# Patient Record
Sex: Male | Born: 1960 | Race: White | Hispanic: Yes | Marital: Single | Smoking: Former smoker
Health system: Southern US, Community
[De-identification: ages and names within clinical notes are randomized; demographics above are authoritative.]

## PROBLEM LIST (undated history)

## (undated) DIAGNOSIS — I10 Essential (primary) hypertension: Secondary | ICD-10-CM

---

## 2011-08-08 ENCOUNTER — Emergency Department (HOSPITAL_COMMUNITY): Payer: Self-pay

## 2011-08-08 ENCOUNTER — Encounter (HOSPITAL_COMMUNITY): Payer: Self-pay | Admitting: Emergency Medicine

## 2011-08-08 ENCOUNTER — Emergency Department (HOSPITAL_COMMUNITY)
Admission: EM | Admit: 2011-08-08 | Discharge: 2011-08-08 | Disposition: A | Discharge status: Nursing Home (Includes ICF) | Payer: Self-pay | Source: Home / Self Care | Attending: Emergency Medicine | Admitting: Emergency Medicine

## 2011-08-08 ENCOUNTER — Encounter (HOSPITAL_COMMUNITY)
Admission: EM | Disposition: A | Discharge status: Home / Self Care | Payer: Self-pay | Source: Ambulatory Visit | Attending: Emergency Medicine

## 2011-08-08 ENCOUNTER — Encounter (HOSPITAL_COMMUNITY): Payer: Self-pay

## 2011-08-08 ENCOUNTER — Observation Stay (HOSPITAL_COMMUNITY)
Admission: EM | Admit: 2011-08-08 | Discharge: 2011-08-09 | Disposition: A | Discharge status: Home / Self Care | Payer: Self-pay | Source: Ambulatory Visit | Attending: General Surgery | Admitting: General Surgery

## 2011-08-08 ENCOUNTER — Ambulatory Visit: Admit: 2011-08-08 | Payer: Self-pay | Admitting: General Surgery

## 2011-08-08 DIAGNOSIS — K3580 Unspecified acute appendicitis: Principal | ICD-10-CM | POA: Insufficient documentation

## 2011-08-08 DIAGNOSIS — I10 Essential (primary) hypertension: Secondary | ICD-10-CM | POA: Insufficient documentation

## 2011-08-08 DIAGNOSIS — K37 Unspecified appendicitis: Secondary | ICD-10-CM

## 2011-08-08 DIAGNOSIS — R10813 Right lower quadrant abdominal tenderness: Secondary | ICD-10-CM

## 2011-08-08 HISTORY — PX: LAPAROSCOPIC APPENDECTOMY: SHX408

## 2011-08-08 HISTORY — DX: Essential (primary) hypertension: I10

## 2011-08-08 LAB — POCT URINALYSIS DIP (DEVICE)
Glucose, UA: NEGATIVE mg/dL
Ketones, ur: NEGATIVE mg/dL
Nitrite: NEGATIVE
Specific Gravity, Urine: 1.015 (ref 1.005–1.030)

## 2011-08-08 LAB — CBC WITH DIFFERENTIAL/PLATELET
Eosinophils Absolute: 0 10*3/uL (ref 0.0–0.7)
Hemoglobin: 17.4 g/dL — ABNORMAL HIGH (ref 13.0–17.0)
Lymphs Abs: 2.2 10*3/uL (ref 0.7–4.0)
MCH: 31.2 pg (ref 26.0–34.0)
MCV: 86.4 fL (ref 78.0–100.0)
Monocytes Relative: 6 % (ref 3–12)
Neutrophils Relative %: 81 % — ABNORMAL HIGH (ref 43–77)
RBC: 5.58 MIL/uL (ref 4.22–5.81)

## 2011-08-08 LAB — COMPREHENSIVE METABOLIC PANEL
Alkaline Phosphatase: 110 U/L (ref 39–117)
BUN: 13 mg/dL (ref 6–23)
GFR calc Af Amer: >90 mL/min (ref >90)
Glucose, Bld: 105 mg/dL — ABNORMAL HIGH (ref 70–99)
Potassium: 4.7 mEq/L (ref 3.5–5.1)
Total Bilirubin: 0.7 mg/dL (ref 0.3–1.2)
Total Protein: 8.3 g/dL (ref 6.0–8.3)

## 2011-08-08 SURGERY — APPENDECTOMY, LAPAROSCOPIC
Anesthesia: General | Site: Abdomen | Wound class: Clean Contaminated

## 2011-08-08 MED ORDER — IOHEXOL 300 MG/ML  SOLN
20.0000 mL | INTRAMUSCULAR | Status: AC
  Administered 2011-08-08: 20 mL via ORAL

## 2011-08-08 MED ORDER — ONDANSETRON 4 MG PO TBDP
ORAL_TABLET | ORAL | Status: AC
  Filled 2011-08-08: qty 1

## 2011-08-08 MED ORDER — SODIUM CHLORIDE 0.9 % IV SOLN
3.0000 g | Freq: Once | INTRAVENOUS | Status: AC
  Administered 2011-08-08: 3 g via INTRAVENOUS
  Filled 2011-08-08: qty 3

## 2011-08-08 MED ORDER — ONDANSETRON 4 MG PO TBDP
4.0000 mg | ORAL_TABLET | Freq: Once | ORAL | Status: AC
  Administered 2011-08-08: 4 mg via ORAL

## 2011-08-08 MED ORDER — HYDROMORPHONE HCL PF 1 MG/ML IJ SOLN
INTRAMUSCULAR | Status: AC
  Filled 2011-08-08: qty 1

## 2011-08-08 MED ORDER — HYDROMORPHONE HCL PF 1 MG/ML IJ SOLN
1.0000 mg | Freq: Once | INTRAMUSCULAR | Status: AC
  Administered 2011-08-08: 1 mg via INTRAMUSCULAR

## 2011-08-08 MED ORDER — IOHEXOL 300 MG/ML  SOLN
100.0000 mL | Freq: Once | INTRAMUSCULAR | Status: AC | PRN
  Administered 2011-08-08: 100 mL via INTRAVENOUS

## 2011-08-08 SURGICAL SUPPLY — 43 items
APPLIER CLIP ROT 10 11.4 M/L (STAPLE)
BLADE SURG ROTATE 9660 (MISCELLANEOUS) ×2 IMPLANT
CANISTER SUCTION 2500CC (MISCELLANEOUS) ×2 IMPLANT
CHLORAPREP W/TINT 26ML (MISCELLANEOUS) ×2 IMPLANT
CLIP APPLIE ROT 10 11.4 M/L (STAPLE) IMPLANT
CLOTH BEACON ORANGE TIMEOUT ST (SAFETY) ×2 IMPLANT
COVER SURGICAL LIGHT HANDLE (MISCELLANEOUS) ×2 IMPLANT
CUTTER LINEAR ENDO 35 ETS (STAPLE) ×2 IMPLANT
CUTTER LINEAR ENDO 35 ETS TH (STAPLE) ×2 IMPLANT
DECANTER SPIKE VIAL GLASS SM (MISCELLANEOUS) IMPLANT
DERMABOND ADHESIVE PROPEN (GAUZE/BANDAGES/DRESSINGS) ×1
DERMABOND ADVANCED (GAUZE/BANDAGES/DRESSINGS) ×1
DERMABOND ADVANCED .7 DNX12 (GAUZE/BANDAGES/DRESSINGS) ×1 IMPLANT
DERMABOND ADVANCED .7 DNX6 (GAUZE/BANDAGES/DRESSINGS) ×1 IMPLANT
DRAPE UTILITY 15X26 W/TAPE STR (DRAPE) ×4 IMPLANT
DRSG OPSITE 4X5.5 SM (GAUZE/BANDAGES/DRESSINGS) ×2 IMPLANT
ELECT REM PT RETURN 9FT ADLT (ELECTROSURGICAL) ×2
ELECTRODE REM PT RTRN 9FT ADLT (ELECTROSURGICAL) ×1 IMPLANT
ENDOLOOP SUT PDS II  0 18 (SUTURE)
ENDOLOOP SUT PDS II 0 18 (SUTURE) IMPLANT
GLOVE BIOGEL PI IND STRL 8 (GLOVE) ×1 IMPLANT
GLOVE BIOGEL PI INDICATOR 8 (GLOVE) ×1
GLOVE ECLIPSE 7.5 STRL STRAW (GLOVE) ×2 IMPLANT
GOWN STRL NON-REIN LRG LVL3 (GOWN DISPOSABLE) ×4 IMPLANT
KIT BASIN OR (CUSTOM PROCEDURE TRAY) ×2 IMPLANT
KIT ROOM TURNOVER OR (KITS) ×2 IMPLANT
NS IRRIG 1000ML POUR BTL (IV SOLUTION) ×2 IMPLANT
PAD ARMBOARD 7.5X6 YLW CONV (MISCELLANEOUS) ×4 IMPLANT
PENCIL BUTTON HOLSTER BLD 10FT (ELECTRODE) IMPLANT
POUCH SPECIMEN RETRIEVAL 10MM (ENDOMECHANICALS) ×2 IMPLANT
RELOAD /EVU35 (ENDOMECHANICALS) IMPLANT
RELOAD CUTTER ETS 35MM STAND (ENDOMECHANICALS) IMPLANT
SET IRRIG TUBING LAPAROSCOPIC (IRRIGATION / IRRIGATOR) ×2 IMPLANT
SPECIMEN JAR SMALL (MISCELLANEOUS) ×2 IMPLANT
SUT MNCRL AB 4-0 PS2 18 (SUTURE) ×2 IMPLANT
TOWEL OR 17X24 6PK STRL BLUE (TOWEL DISPOSABLE) ×2 IMPLANT
TOWEL OR 17X26 10 PK STRL BLUE (TOWEL DISPOSABLE) ×2 IMPLANT
TRAY FOLEY CATH 14FR (SET/KITS/TRAYS/PACK) ×2 IMPLANT
TRAY LAPAROSCOPIC (CUSTOM PROCEDURE TRAY) ×2 IMPLANT
TROCAR XCEL 12X100 BLDLESS (ENDOMECHANICALS) ×2 IMPLANT
TROCAR XCEL BLUNT TIP 100MML (ENDOMECHANICALS) ×2 IMPLANT
TROCAR XCEL NON-BLD 5MMX100MML (ENDOMECHANICALS) ×2 IMPLANT
WATER STERILE IRR 1000ML POUR (IV SOLUTION) IMPLANT

## 2011-08-08 NOTE — ED Notes (Signed)
Pt has RLQ pain since yesterday, had fever this am.  Thought his pain was d/t no bm for 3 days, but continues to hurt and very uncomfortable.

## 2011-08-08 NOTE — ED Provider Notes (Signed)
History     CSN: [id number]  Arrival date & time 08/08/11  1252   First MD Initiated Contact with Patient 08/08/11 1314      Chief Complaint  Patient presents with  . Abdominal Pain    (Consider location/radiation/quality/duration/timing/severity/associated sxs/prior treatment) HPI Comments: Pt with sudden onset constant nonradiating "crushing" RLQ abd pain starting today. Pain is worse with movement, better when he lies on his right side. He also tried Aleve and milk of magnesia without improvement. He took the Aleve several hours ago. C/o anorexia, states he felt feverish at home but no documented temperature. C/o abd distension starting today when the pain started. No N/V. States that the car ride over here was painful. Has been having issues with constipation for several days, but had a small BM this am which did not affect the pain. C/o oderous urine. No dysuria, urgency, frequency cloudy  urine, hematuria. No testicular pain,  Swelling, penile discharge. No h/o abdominal surgeries.   ROS as noted in HPI. All other ROS negative.   Patient is a 51 y.o. male presenting with abdominal pain. The history is provided by the patient. The history is limited by a language barrier. A language interpreter was used.  Abdominal Pain The primary symptoms of the illness include abdominal pain. The current episode started 3 to 5 hours ago. The onset of the illness was sudden. The problem has not changed since onset. The abdominal pain is located in the RLQ. The abdominal pain does not radiate. The abdominal pain is relieved by being still and certain positions. The abdominal pain is exacerbated by movement.  The patient has had a change in bowel habit. Additional symptoms associated with the illness include anorexia and constipation. Symptoms associated with the illness do not include chills, urgency, hematuria, frequency or back pain. Significant associated medical issues do not include diabetes,  gallstones or liver disease.    History reviewed. No pertinent past medical history.  History reviewed. No pertinent past surgical history.  History reviewed. No pertinent family history.  History  Substance Use Topics  . Smoking status: Not on file  . Smokeless tobacco: Not on file  . Alcohol Use: No      Review of Systems  Constitutional: Negative for chills.  Gastrointestinal: Positive for abdominal pain, constipation and anorexia.  Genitourinary: Negative for urgency, frequency and hematuria.  Musculoskeletal: Negative for back pain.    Allergies  Review of patient's allergies indicates no known allergies.  Home Medications   Current Outpatient Rx  Name Route Sig Dispense Refill  . MAGNESIUM HYDROXIDE 400 MG/5ML PO SUSP Oral Take by mouth daily as needed.    Marland Kitchen NAPROXEN SODIUM 220 MG PO TABS Oral Take 220 mg by mouth 2 (two) times daily with a meal.      BP 157/83  Pulse 70  Temp 98.9 F (37.2 C) (Oral)  Resp 18  SpO2 96%  Physical Exam  Nursing note and vitals reviewed. Constitutional: He is oriented to person, place, and time. He appears well-developed and well-nourished.  HENT:  Head: Normocephalic and atraumatic.  Eyes: Conjunctivae and EOM are normal.  Neck: Normal range of motion.  Cardiovascular: Normal rate.   Pulmonary/Chest: Effort normal. No respiratory distress.  Abdominal: Soft. Normal appearance and bowel sounds are normal. He exhibits no distension. There is tenderness. There is guarding and tenderness at McBurney's point. There is no rigidity, no rebound, no CVA tenderness and negative Murphy's sign. No hernia. Hernia confirmed negative in the right  inguinal area and confirmed negative in the left inguinal area.  Genitourinary: Testes normal and penis normal. Uncircumcised.  Musculoskeletal: Normal range of motion.  Neurological: He is alert and oriented to person, place, and time. Coordination normal.  Skin: Skin is warm and dry.    Psychiatric: He has a normal mood and affect. His behavior is normal. Judgment and thought content normal.    ED Course  Procedures (including critical care time)  Labs Reviewed  POCT URINALYSIS DIP (DEVICE) - Abnormal; Notable for the following:    Bilirubin Urine SMALL (*)     Hgb urine dipstick MODERATE (*)     Protein, ur 30 (*)     All other components within normal limits   No results found.   1. RLQ abdominal tenderness     Results for orders placed during the hospital encounter of 08/08/11  POCT URINALYSIS DIP (DEVICE)      Component Value Range   Glucose, UA NEGATIVE  NEGATIVE mg/dL   Bilirubin Urine SMALL (*) NEGATIVE   Ketones, ur NEGATIVE  NEGATIVE mg/dL   Specific Gravity, Urine 1.015  1.005 - 1.030   Hgb urine dipstick MODERATE (*) NEGATIVE   pH 7.5  5.0 - 8.0   Protein, ur 30 (*) NEGATIVE mg/dL   Urobilinogen, UA 0.2  0.0 - 1.0 mg/dL   Nitrite NEGATIVE  NEGATIVE   Leukocytes, UA NEGATIVE  NEGATIVE    MDM  No previous records available.  Concern for appendicitis given history and physical. Nephrolithiasis also in differential as he does have some hemoglobin in his urine, but he has no suprapubic, flank, CVA tenderness. He has no history of nephrolithiasis. Giving Zofran, Dilaudid here. Vitals are acceptable, transferring to the ED for CT scan.  Luiz Blare, MD 08/08/11 [phone]

## 2011-08-08 NOTE — ED Provider Notes (Signed)
History     CSN: [id number]  Arrival date & time 08/08/11  1522   First MD Initiated Contact with Patient 08/08/11 1847      Chief Complaint  Patient presents with  . Abdominal Pain    (Consider location/radiation/quality/duration/timing/severity/associated sxs/prior treatment) HPI  Erik Paul is a 51 year-old Hispanic male who complains of right lower quadrant abdominal pain that started this morning. He rates the pain as a 6/10 and states that the pain does not radiate. He states that he has been constipated for 3 days and has only had 1 small bowel movement today that was not relieving. He reports feeling nauseous but has not vomited. He states that he has felt feverish today but has not measured his temperature. He has tried Aleve and milk of magnesia for the pain without relief. He denies chills, dysuria, urinary urgency or frequency, hematuria, back or flank pain.    Past Medical History  Diagnosis Date  . Hypertension     History reviewed. No pertinent past surgical history.  No family history on file.  History  Substance Use Topics  . Smoking status: Never Smoker   . Smokeless tobacco: Not on file  . Alcohol Use: No      Review of Systems All other systems negative except as documented in the HPI. All pertinent positives and negatives as reviewed in the HPI.  Allergies  Review of patient's allergies indicates no known allergies.  Home Medications   Current Outpatient Rx  Name Route Sig Dispense Refill  . MAGNESIUM HYDROXIDE 400 MG/5ML PO SUSP Oral Take by mouth daily as needed.      BP 187/157  Pulse 63  Temp 97.9 F (36.6 C) (Oral)  Resp 22  SpO2 96%  Physical Exam  Nursing note and vitals reviewed. Constitutional: He appears well-developed and well-nourished.  HENT:  Head: Normocephalic and atraumatic.  Eyes: Pupils are equal, round, and reactive to light.  Cardiovascular: Normal rate, regular rhythm and normal heart sounds.  Exam reveals  no gallop and no friction rub.   No murmur heard. Pulmonary/Chest: Effort normal and breath sounds normal.  Abdominal: Soft. Normal appearance and bowel sounds are normal. There is tenderness in the right lower quadrant. There is rebound and guarding. There is no rigidity.    Skin: Skin is warm and dry.    ED Course  Procedures (including critical care time)  Labs Reviewed  CBC WITH DIFFERENTIAL - Abnormal; Notable for the following:    WBC 16.6 (*)     Hemoglobin 17.4 (*)     MCHC 36.1 (*)     Neutrophils Relative 81 (*)     Neutro Abs 13.4 (*)     All other components within normal limits  COMPREHENSIVE METABOLIC PANEL - Abnormal; Notable for the following:    Glucose, Bld 105 (*)     All other components within normal limits  URINALYSIS, ROUTINE W REFLEX MICROSCOPIC   I spoke with Dr. Lindie Spruce about the patient and he will be in to see him. The patient is given Unasyn 3g.    MDM  MDM Reviewed: nursing note and vitals Interpretation: labs and CT scan Consults: general surgery            Carlyle Dolly, PA-C 08/08/11 2214

## 2011-08-08 NOTE — ED Notes (Signed)
Pt speaks spanish- interpreter phone used; pt reports that for past 3 days has not been able to have BM, c/o feeling bloated; pt also reports RLQ pain; tender with palpation; also c/o lethargy, fevers (unsure how high); slight nausea; unable to eat today

## 2011-08-08 NOTE — Anesthesia Preprocedure Evaluation (Signed)
Anesthesia Evaluation  Patient identified by MRN, date of birth, ID band Patient awake    Reviewed: Allergy & Precautions, H&P , NPO status , Patient's Chart, lab work & pertinent test results  Airway Mallampati: II      Dental  (+) Teeth Intact   Pulmonary  breath sounds clear to auscultation        Cardiovascular Rhythm:Regular Rate:Normal     Neuro/Psych    GI/Hepatic   Endo/Other    Renal/GU      Musculoskeletal   Abdominal   Peds  Hematology   Anesthesia Other Findings   Reproductive/Obstetrics                           Anesthesia Physical Anesthesia Plan  ASA: II and Emergent  Anesthesia Plan: General   Post-op Pain Management:    Induction: Intravenous  Airway Management Planned: Oral ETT  Additional Equipment:   Intra-op Plan:   Post-operative Plan: Extubation in OR  Informed Consent: I have reviewed the patients History and Physical, chart, labs and discussed the procedure including the risks, benefits and alternatives for the proposed anesthesia with the patient or authorized representative who has indicated his/her understanding and acceptance.   Dental advisory given  Plan Discussed with:   Anesthesia Plan Comments: (Acute appendicitis Htn not taking med  Plan GA with RSI  Kipp Brood, MD)        Anesthesia Quick Evaluation

## 2011-08-08 NOTE — H&P (Signed)
Erik Paul is an 51 y.o. male.   Chief Complaint: Abdominal pain, CT diagnosed acute appendicitis HPI: Pain since early this AM.  Sent down from Urgent Care.  Ct done demonstrating acute appendicitsis  Past Medical History  Diagnosis Date  . Hypertension     History reviewed. No pertinent past surgical history.  No family history on file. Social History:  reports that he has never smoked. He does not have any smokeless tobacco history on file. He reports that he does not drink alcohol or use illicit drugs.  Allergies: No Known Allergies   (Not in a hospital admission)  Results for orders placed during the hospital encounter of 08/08/11 (from the past 48 hour(s))  CBC WITH DIFFERENTIAL     Status: Abnormal   Collection Time   08/08/11  3:53 PM      Component Value Range Comment   WBC 16.6 (*) 4.0 - 10.5 K/uL    RBC 5.58  4.22 - 5.81 MIL/uL    Hemoglobin 17.4 (*) 13.0 - 17.0 g/dL    HCT 16.1  09.6 - 04.5 %    MCV 86.4  78.0 - 100.0 fL    MCH 31.2  26.0 - 34.0 pg    MCHC 36.1 (*) 30.0 - 36.0 g/dL    RDW 40.9  81.1 - 91.4 %    Platelets 192  150 - 400 K/uL    Neutrophils Relative 81 (*) 43 - 77 %    Neutro Abs 13.4 (*) 1.7 - 7.7 K/uL    Lymphocytes Relative 13  12 - 46 %    Lymphs Abs 2.2  0.7 - 4.0 K/uL    Monocytes Relative 6  3 - 12 %    Monocytes Absolute 1.0  0.1 - 1.0 K/uL    Eosinophils Relative 0  0 - 5 %    Eosinophils Absolute 0.0  0.0 - 0.7 K/uL    Basophils Relative 0  0 - 1 %    Basophils Absolute 0.0  0.0 - 0.1 K/uL   COMPREHENSIVE METABOLIC PANEL     Status: Abnormal   Collection Time   08/08/11  3:53 PM      Component Value Range Comment   Sodium 135  135 - 145 mEq/L    Potassium 4.7  3.5 - 5.1 mEq/L    Chloride 98  96 - 112 mEq/L    CO2 27  19 - 32 mEq/L    Glucose, Bld 105 (*) 70 - 99 mg/dL    BUN 13  6 - 23 mg/dL    Creatinine, Ser 7.82  0.50 - 1.35 mg/dL    Calcium 9.5  8.4 - 95.6 mg/dL    Total Protein 8.3  6.0 - 8.3 g/dL    Albumin 4.2   3.5 - 5.2 g/dL    AST 28  0 - 37 U/L    ALT 43  0 - 53 U/L    Alkaline Phosphatase 110  39 - 117 U/L    Total Bilirubin 0.7  0.3 - 1.2 mg/dL    GFR calc non Af Amer >90  >90 mL/min    GFR calc Af Amer >90  >90 mL/min    Ct Abdomen Pelvis W Contrast  08/08/2011  *RADIOLOGY REPORT*  Clinical Data: Right lower quadrant pain, fever  CT ABDOMEN AND PELVIS WITH CONTRAST  Technique:  Multidetector CT imaging of the abdomen and pelvis was performed following the standard protocol during bolus administration of intravenous contrast.  Contrast:  OMNIPAQUE IOHEXOL 300 MG/ML  SOLN  Comparison: None.  Findings: Lung bases are clear.  No pericardial fluid.  No focal hepatic lesion.  There is small hypervascular focus adjacent to the gallbladder fossa (image 28).  This is likely benign.  The pancreas, spleen, adrenal glands are normal.  There is high density rounded exophytic lesion extend from the right kidney which measures 30 mm x 22 mm.  Smaller high density lesion measuring 11 mm and the left kidney.  The stomach, small bowel, cecum are normal.  The appendix extends inferiorly from the cecal tip and is short measuring only 4 cm. The appendix is thickened to 11 mm in diameter and has a hyperemic thickened wall.  Additionally, there is a small amount of periappendiceal fluid extending along the right pericolic gutter (image 58).  These findings are concerning for mild acute appendicitis.  The colon and rectosigmoid colon are normal.  Abdominal aorta is normal caliber.  No retroperitoneal periportal lymphadenopathy.  No free fluid the pelvis.  The bladder and prostate gland are normal.  No pelvic lymphadenopathy. Review of  bone windows demonstrates no aggressive osseous lesions.  IMPRESSION:  1. Acute non complicated appendicitis. 2.  High density exophytic lesion extend from the left kidney may represent hemorrhagic cysts however cannot exclude enhancing solid renal lesion.  Recommend MRI renal protocol without  and with contrast for further evaluation. 3.  Small hypervascular lesion along the gallbladder fossa in the right hepatic lobe likely represents a benign vascular phenomenon.  Original Report Authenticated By: Genevive Bi, M.D.    Review of Systems  Constitutional: Negative for fever, chills and weight loss.  HENT: Negative.   Eyes: Negative.   Respiratory: Negative.   Gastrointestinal: Positive for nausea and abdominal pain. Negative for vomiting, diarrhea, constipation and blood in stool.  Genitourinary: Negative.   Musculoskeletal: Negative.   Skin: Negative.   Neurological: Negative.   Endo/Heme/Allergies: Negative.   Psychiatric/Behavioral: Negative.     Blood pressure 187/157, pulse 63, temperature 97.9 F (36.6 C), temperature source Oral, resp. rate 22, SpO2 96.00%. Physical Exam  Constitutional: He is oriented to person, place, and time. He appears well-developed and well-nourished.  HENT:  Head: Normocephalic and atraumatic.  Eyes: Conjunctivae and EOM are normal. Pupils are equal, round, and reactive to light.  Neck: Normal range of motion. Neck supple.  Cardiovascular: Normal rate, regular rhythm and normal heart sounds.   No murmur heard. Respiratory: Effort normal and breath sounds normal. Tenderness: mild.  GI: Soft. He exhibits distension (RLQ, McBurney's). He exhibits no mass. There is tenderness in the right lower quadrant. There is rebound and guarding.  Genitourinary: Penis normal.  Musculoskeletal: Normal range of motion.  Neurological: He is alert and oriented to person, place, and time. He has normal reflexes.  Skin: Skin is warm and dry.  Psychiatric: He has a normal mood and affect. His behavior is normal. Judgment and thought content normal.     Assessment/Plan Acute Appendicitis without evidence of perforation  Unasyn preop OR for Laparoscopic Appendectomy  Antaeus Karel O 08/08/2011, 10:22 PM

## 2011-08-09 ENCOUNTER — Emergency Department (HOSPITAL_COMMUNITY): Payer: Self-pay | Admitting: Anesthesiology

## 2011-08-09 ENCOUNTER — Encounter (HOSPITAL_COMMUNITY): Payer: Self-pay | Admitting: Anesthesiology

## 2011-08-09 MED ORDER — METOCLOPRAMIDE HCL 5 MG/ML IJ SOLN: INTRAMUSCULAR | Status: DC | PRN

## 2011-08-09 MED ORDER — HYDROCODONE-ACETAMINOPHEN 5-325 MG PO TABS: 1.0000 | ORAL_TABLET | ORAL | Status: DC | PRN

## 2011-08-09 MED ORDER — KCL IN DEXTROSE-NACL 20-5-0.45 MEQ/L-%-% IV SOLN
INTRAVENOUS | Status: DC
  Administered 2011-08-09: 05:00:00 via INTRAVENOUS
  Filled 2011-08-09 (×3): qty 1000

## 2011-08-09 MED ORDER — ACETAMINOPHEN 10 MG/ML IV SOLN
INTRAVENOUS | Status: DC | PRN
  Administered 2011-08-09: 1000 mg via INTRAVENOUS

## 2011-08-09 MED ORDER — ONDANSETRON HCL 4 MG/2ML IJ SOLN
INTRAMUSCULAR | Status: DC | PRN
  Administered 2011-08-09: 4 mg via INTRAVENOUS

## 2011-08-09 MED ORDER — SODIUM CHLORIDE 0.9 % IV SOLN
3.0000 g | Freq: Four times a day (QID) | INTRAVENOUS | Status: AC
  Administered 2011-08-09: 3 g via INTRAVENOUS
  Filled 2011-08-09: qty 3

## 2011-08-09 MED ORDER — KETOROLAC TROMETHAMINE 30 MG/ML IJ SOLN
30.0000 mg | Freq: Four times a day (QID) | INTRAMUSCULAR | Status: DC
  Administered 2011-08-09 (×2): 30 mg via INTRAVENOUS
  Filled 2011-08-09 (×5): qty 1

## 2011-08-09 MED ORDER — NEOSTIGMINE METHYLSULFATE 1 MG/ML IJ SOLN
INTRAMUSCULAR | Status: DC | PRN
  Administered 2011-08-09: 5 mg via INTRAVENOUS

## 2011-08-09 MED ORDER — FENTANYL CITRATE 0.05 MG/ML IJ SOLN
INTRAMUSCULAR | Status: DC | PRN
  Administered 2011-08-09: 100 ug via INTRAVENOUS
  Administered 2011-08-09: 150 ug via INTRAVENOUS
  Administered 2011-08-09: 100 ug via INTRAVENOUS

## 2011-08-09 MED ORDER — LIDOCAINE HCL (CARDIAC) 20 MG/ML IV SOLN
INTRAVENOUS | Status: DC | PRN
  Administered 2011-08-09: 40 mg via INTRAVENOUS

## 2011-08-09 MED ORDER — MORPHINE SULFATE 2 MG/ML IJ SOLN
1.0000 mg | INTRAMUSCULAR | Status: DC | PRN
  Administered 2011-08-09: 1 mg via INTRAVENOUS

## 2011-08-09 MED ORDER — MIDAZOLAM HCL 5 MG/5ML IJ SOLN: INTRAMUSCULAR | Status: DC | PRN

## 2011-08-09 MED ORDER — ONDANSETRON HCL 4 MG/2ML IJ SOLN: 4.0000 mg | Freq: Four times a day (QID) | INTRAMUSCULAR | Status: DC | PRN

## 2011-08-09 MED ORDER — MORPHINE SULFATE 2 MG/ML IJ SOLN
INTRAMUSCULAR | Status: AC
  Filled 2011-08-09: qty 1

## 2011-08-09 MED ORDER — SUCCINYLCHOLINE CHLORIDE 20 MG/ML IJ SOLN
INTRAMUSCULAR | Status: DC | PRN
  Administered 2011-08-09: 140 mg via INTRAVENOUS

## 2011-08-09 MED ORDER — SODIUM CHLORIDE 0.9 % IR SOLN
Status: DC | PRN
  Administered 2011-08-09: 1000 mL

## 2011-08-09 MED ORDER — BUPIVACAINE-EPINEPHRINE 0.25% -1:200000 IJ SOLN
INTRAMUSCULAR | Status: DC | PRN
  Administered 2011-08-09: 18 mL

## 2011-08-09 MED ORDER — DEXAMETHASONE SODIUM PHOSPHATE 4 MG/ML IJ SOLN
INTRAMUSCULAR | Status: DC | PRN
  Administered 2011-08-09: 6 mg via INTRAVENOUS

## 2011-08-09 MED ORDER — SODIUM CHLORIDE 0.9 % IV SOLN
INTRAVENOUS | Status: DC | PRN
  Administered 2011-08-09: via INTRAVENOUS

## 2011-08-09 MED ORDER — DROPERIDOL 2.5 MG/ML IJ SOLN
INTRAMUSCULAR | Status: DC | PRN
  Administered 2011-08-09: 0.625 mg via INTRAVENOUS

## 2011-08-09 MED ORDER — PROPOFOL 10 MG/ML IV EMUL
INTRAVENOUS | Status: DC | PRN
  Administered 2011-08-09: 180 mg via INTRAVENOUS

## 2011-08-09 MED ORDER — ONDANSETRON HCL 4 MG PO TABS: 4.0000 mg | ORAL_TABLET | Freq: Four times a day (QID) | ORAL | Status: DC | PRN

## 2011-08-09 MED ORDER — VECURONIUM BROMIDE 10 MG IV SOLR
INTRAVENOUS | Status: DC | PRN
  Administered 2011-08-09: 5 mg via INTRAVENOUS

## 2011-08-09 MED ORDER — LACTATED RINGERS IV SOLN
INTRAVENOUS | Status: DC | PRN
  Administered 2011-08-09: 01:00:00 via INTRAVENOUS

## 2011-08-09 MED ORDER — MORPHINE SULFATE 2 MG/ML IJ SOLN: 2.0000 mg | INTRAMUSCULAR | Status: DC | PRN

## 2011-08-09 MED ORDER — MIDAZOLAM HCL 5 MG/5ML IJ SOLN
INTRAMUSCULAR | Status: DC | PRN
  Administered 2011-08-09: 2 mg via INTRAVENOUS

## 2011-08-09 MED ORDER — ONDANSETRON HCL 4 MG/2ML IJ SOLN: 4.0000 mg | Freq: Once | INTRAMUSCULAR | Status: DC | PRN

## 2011-08-09 MED ORDER — HYDROCODONE-ACETAMINOPHEN 5-325 MG PO TABS: 1.0000 | ORAL_TABLET | ORAL | Status: AC | PRN

## 2011-08-09 MED ORDER — GLYCOPYRROLATE 0.2 MG/ML IJ SOLN
INTRAMUSCULAR | Status: DC | PRN
  Administered 2011-08-09: .6 mg via INTRAVENOUS

## 2011-08-09 MED ORDER — ACETAMINOPHEN 10 MG/ML IV SOLN: 1000.0000 mg | Freq: Once | INTRAVENOUS | Status: DC | PRN

## 2011-08-09 MED ORDER — METOCLOPRAMIDE HCL 5 MG/ML IJ SOLN
INTRAMUSCULAR | Status: DC | PRN
  Administered 2011-08-09: 10 mg via INTRAVENOUS

## 2011-08-09 MED ORDER — ENOXAPARIN SODIUM 40 MG/0.4ML ~~LOC~~ SOLN
40.0000 mg | SUBCUTANEOUS | Status: DC
  Administered 2011-08-09: 40 mg via SUBCUTANEOUS
  Filled 2011-08-09: qty 0.4

## 2011-08-09 MED ORDER — LACTATED RINGERS IV SOLN: INTRAVENOUS | Status: DC | PRN

## 2011-08-09 MED ORDER — ACETAMINOPHEN 325 MG PO TABS: 650.0000 mg | ORAL_TABLET | ORAL | Status: DC | PRN

## 2011-08-09 NOTE — Progress Notes (Signed)
1 Day Post-Op  Subjective: Doing well. Tolerating liquid diet. Good pain control. Voiding without difficulty. Interested in going home.  Objective: Vital signs in last 24 hours: Temp:  [97.4 F (36.3 C)-98.9 F (37.2 C)] 97.9 F (36.6 C) (07/07 0543) Pulse Rate:  [56-79] 79  (07/07 0543) Resp:  [15-22] 18  (07/07 0543) BP: (125-187)/(71-157) 126/71 mmHg (07/07 0543) SpO2:  [94 %-100 %] 94 % (07/07 0543) Weight:  [223 lb 4.8 oz (101.288 kg)] 223 lb 4.8 oz (101.288 kg) (07/07 0255)    Intake/Output from previous day: 07/06 0701 - 07/07 0700 In: 1300 [I.V.:1300] Out: 870 [Urine:820; Blood:50] Intake/Output this shift:    General appearance: alert. In no distress. Mental status normal. Wife in room who is Nurse, learning disability. GI: abdomen soft, not distended, wounds looked fine. Benign postop exam.  Lab Results:  Results for orders placed during the hospital encounter of 08/08/11 (from the past 24 hour(s))  CBC WITH DIFFERENTIAL     Status: Abnormal   Collection Time   08/08/11  3:53 PM      Component Value Range   WBC 16.6 (*) 4.0 - 10.5 K/uL   RBC 5.58  4.22 - 5.81 MIL/uL   Hemoglobin 17.4 (*) 13.0 - 17.0 g/dL   HCT 16.1  09.6 - 04.5 %   MCV 86.4  78.0 - 100.0 fL   MCH 31.2  26.0 - 34.0 pg   MCHC 36.1 (*) 30.0 - 36.0 g/dL   RDW 40.9  81.1 - 91.4 %   Platelets 192  150 - 400 K/uL   Neutrophils Relative 81 (*) 43 - 77 %   Neutro Abs 13.4 (*) 1.7 - 7.7 K/uL   Lymphocytes Relative 13  12 - 46 %   Lymphs Abs 2.2  0.7 - 4.0 K/uL   Monocytes Relative 6  3 - 12 %   Monocytes Absolute 1.0  0.1 - 1.0 K/uL   Eosinophils Relative 0  0 - 5 %   Eosinophils Absolute 0.0  0.0 - 0.7 K/uL   Basophils Relative 0  0 - 1 %   Basophils Absolute 0.0  0.0 - 0.1 K/uL  COMPREHENSIVE METABOLIC PANEL     Status: Abnormal   Collection Time   08/08/11  3:53 PM      Component Value Range   Sodium 135  135 - 145 mEq/L   Potassium 4.7  3.5 - 5.1 mEq/L   Chloride 98  96 - 112 mEq/L   CO2 27  19 - 32 mEq/L     Glucose, Bld 105 (*) 70 - 99 mg/dL   BUN 13  6 - 23 mg/dL   Creatinine, Ser 7.82  0.50 - 1.35 mg/dL   Calcium 9.5  8.4 - 95.6 mg/dL   Total Protein 8.3  6.0 - 8.3 g/dL   Albumin 4.2  3.5 - 5.2 g/dL   AST 28  0 - 37 U/L   ALT 43  0 - 53 U/L   Alkaline Phosphatase 110  39 - 117 U/L   Total Bilirubin 0.7  0.3 - 1.2 mg/dL   GFR calc non Af Amer >90  >90 mL/min   GFR calc Af Amer >90  >90 mL/min     Studies/Results: @[id number] @     . ampicillin-sulbactam (UNASYN) IV  3 g Intravenous Once  . ampicillin-sulbactam (UNASYN) IV  3 g Intravenous Q6H  . enoxaparin (LOVENOX) injection  40 mg Subcutaneous Q24H  . iohexol  20 mL Oral Q1 Hr x 2  .  ketorolac  30 mg Intravenous Q6H  . morphine         Assessment/Plan: s/p Procedure(s): APPENDECTOMY LAPAROSCOPIC  Acute appendicitis, recovering uneventfully following laparoscopic appendectomy. We'll advance to regular diet at lunch, and hopefully discharge home today. Diet and instructions and activities discussed with patient and wife. Prescription for Vicodin given  Return to work as a Curator in 2 weeks. No sports or heavy lifting until then  Return to see Dr. Lindie Spruce  in office in 3 weeks.    LOS: 1 day    Delia Slatten M. Derrell Lolling, M.D., [facility] General and Minimally invasive Surgery Breast and Colorectal Surgery Office:   [phone] Pager:   [phone]  08/09/2011  . .prob

## 2011-08-09 NOTE — Preoperative (Signed)
Beta Blockers   Reason not to administer Beta Blockers:Not Applicable 

## 2011-08-09 NOTE — Anesthesia Postprocedure Evaluation (Signed)
  Anesthesia Post-op Note  Patient: Erik Paul  Procedure(s) Performed: Procedure(s) (LRB): APPENDECTOMY LAPAROSCOPIC (N/A)  Patient Location: PACU  Anesthesia Type: General  Level of Consciousness: awake, alert  and oriented  Airway and Oxygen Therapy: Patient Spontanous Breathing  Post-op Pain: mild  Post-op Assessment: Post-op Vital signs reviewed and Patient's Cardiovascular Status Stable  Post-op Vital Signs: stable  Complications: No apparent anesthesia complications

## 2011-08-09 NOTE — Anesthesia Procedure Notes (Signed)
Procedure Name: Intubation Date/Time: 08/09/2011 12:17 AM Performed by: Wray Kearns A Pre-anesthesia Checklist: Patient identified, Timeout performed, Emergency Drugs available, Suction available and Patient being monitored Patient Re-evaluated:Patient Re-evaluated prior to inductionOxygen Delivery Method: Circle system utilized Preoxygenation: Pre-oxygenation with 100% oxygen Intubation Type: IV induction, Rapid sequence and Cricoid Pressure applied Laryngoscope Size: Mac and 4 Grade View: Grade II Tube type: Oral Tube size: 8.0 mm Number of attempts: 1 Airway Equipment and Method: Stylet and Oral airway Placement Confirmation: ETT inserted through vocal cords under direct vision,  breath sounds checked- equal and bilateral and positive ETCO2 Secured at: 22 cm Tube secured with: Tape Dental Injury: Teeth and Oropharynx as per pre-operative assessment

## 2011-08-09 NOTE — Transfer of Care (Signed)
Immediate Anesthesia Transfer of Care Note  Patient: Erik Paul  Procedure(s) Performed: Procedure(s) (LRB): APPENDECTOMY LAPAROSCOPIC (N/A)  Patient Location: PACU  Anesthesia Type: General  Level of Consciousness: oriented, sedated, patient cooperative and responds to stimulation  Airway & Oxygen Therapy: Patient Spontanous Breathing and Patient connected to nasal cannula oxygen  Post-op Assessment: Report given to PACU RN, Post -op Vital signs reviewed and stable, Patient moving all extremities and Patient moving all extremities X 4  Post vital signs: Reviewed and stable  Complications: No apparent anesthesia complications 

## 2011-08-09 NOTE — Transfer of Care (Signed)
Immediate Anesthesia Transfer of Care Note  Patient: Erik Paul  Procedure(s) Performed: Procedure(s) (LRB): APPENDECTOMY LAPAROSCOPIC (N/A)  Patient Location: PACU  Anesthesia Type: General  Level of Consciousness: oriented, sedated, patient cooperative and responds to stimulation  Airway & Oxygen Therapy: Patient Spontanous Breathing and Patient connected to nasal cannula oxygen  Post-op Assessment: Report given to PACU RN, Post -op Vital signs reviewed and stable, Patient moving all extremities and Patient moving all extremities X 4  Post vital signs: Reviewed and stable  Complications: No apparent anesthesia complications

## 2011-08-09 NOTE — Op Note (Signed)
OPERATIVE REPORT  DATE OF OPERATION: 08/08/2011 - 08/09/2011  PATIENT:  Erik Paul  51 y.o. male  PRE-OPERATIVE DIAGNOSIS:  Acute appendicitis  POST-OPERATIVE DIAGNOSIS:  Acute appendicitis  PROCEDURE:  Procedure(s): APPENDECTOMY LAPAROSCOPIC  SURGEON:  Surgeon(s): Cherylynn Ridges, MD  ASSISTANT: None  ANESTHESIA:   general  EBL: <30 ml  BLOOD ADMINISTERED: none  DRAINS: none   SPECIMEN:  Source of Specimen:  Appendix  COUNTS CORRECT:  YES  PROCEDURE DETAILS: The patient was taken to the operating room and placed on the table in the supine position. After an adequate endotracheal anesthetic was administered he was prepped and draped in usual sterile manner exposing the entire abdomen.  After proper time out was performed identifying the patient and the procedure to be performed a supraumbilical midline incision was made using a #15 blade and taken down to the midline fascia. We incised the midline fascia using a 15 blade then subsequently grabbed the edges with a clamp. As we lifted up on the clamps we bluntly dissected down into the peritoneal cavity using a Kelly clamp. A fascial stitch of 0 Vicryl was passed in a pursestring manner with secured in a Hassan cannula. We subsequently insufflated commandants I gas up to a maximal intra-abdominal pressure of 15 mm mercury through the [facility] cannula.  Subsequently a right upper quadrant 5 mm cannula in the left lower quadrant 12 mm cannula were passed under direct vision. The patient was placed in Trendelenburg position and the left side was tilted down.  The appendix could be seen coursing laterally and tethered to the peritoneum on that side. Adhesions were taken down using electrocautery and we will able to rotate the cecum medially along with the base of the appendix. As the appendix was freed we made a window between the mesoappendix and the base of the cecum using a Art gallery manager. We came across the base of the appendix  at the cecum using an Endo GIA with 3.5 mm blue staples. We subsequently dissected out the mesoappendix and came across that with a 2.5 mm Endo GIA. Once this was done the appendix was completely detached. We removed it from the left lower quadrant cannula site using an Endo Catch bag.  Once the appendix was removed we irrigated the right lower quadrant with saline and attained adequate hemostasis. We aspirated all fluid and gas from above the liver. We closed the fascia at the subumbilical site using the pursestring suture which was in place. Once that was done we injected 0.25% Marcaine with epinephrine in all sites. The skin at the subumbilical and the left lower quadrant site were closed using running subcuticular stitch of 4-0 Monocryl. Steri-Strips Dermabond and Tegaderms used to complete the dressings. All needle counts, sponge counts, and instrument counts were correct.  PATIENT DISPOSITION:  PACU - hemodynamically stable.   [name] O 7/7/20131:17 AM

## 2011-08-09 NOTE — Discharge Summary (Signed)
  Patient ID: Erik Paul [id number] 51 y.o. 1960-09-28  08/08/2011  Discharge date and time: Jul;y 7, 2013  Admitting Physician: Jimmye Norman, MD  Discharge Physician: Ernestene Mention  Admission Diagnoses: Appendicitis [541] Rlq pain  Discharge Diagnoses: acute appendicitis  Operations: Procedure(s): APPENDECTOMY LAPAROSCOPIC  Admission Condition: fair  Discharged Condition: good  Indication for Admission: this 51 year old Hispanic gentleman presented to the emergency room upon referral from urgent care. He had had pain for 12 hours. A CT scan was done that demonstrated acute appendicitis. Dr. Lindie Spruce evaluated the patient in the emergency department and the patient was taken to the operating room urgently.  Hospital Course: the patient was given IV fluid resuscitation and broad-spectrum antibiotics and was taken to the operating room and underwent a laparoscopic appendectomy. Findings were non-ruptured acute appendicitis. Postoperatively the patient progressed in his diet and activities well. Diet was advanced from clear liquids to low-fat and he tolerated that well. No problems with voiding. He wanted to go home. Abdominal exam was fairly benign on the day of discharge.  Diet and activities were discussed. Return to work issues were discussed. Followup with Dr. Lindie Spruce was arranged in 3 weeks. Prescription for Vicodin given.  Consults: None  Significant Diagnostic Studies: radiology: CT scan: abdomen  Treatments: surgery: laparoscopic appendectomy  Disposition: Home  Patient Instructions:   [name], [name]  Home Medication Instructions [id number]   Printed on:08/09/11 1031  Medication Information                    magnesium hydroxide (MILK OF MAGNESIA) 400 MG/5ML suspension Take by mouth daily as needed.             Activity: no sports or heavy lifting for 2 weeks. Patient may return to work after that. May walk an unlimited amount. May drive car when  comfortable. May shower. Diet: regular diet Wound Care: none needed  Follow-up:  With Dr. Lindie Spruce in 3 weeks.  Signed: Angelia Mould. Derrell Lolling, M.D., FACS General and minimally invasive surgery Breast and Colorectal Surgery  08/09/2011, 10:31 AM

## 2011-08-10 ENCOUNTER — Encounter (HOSPITAL_COMMUNITY): Payer: Self-pay | Admitting: General Surgery

## 2011-08-11 NOTE — Progress Notes (Signed)
Retro-UR complete.  

## 2011-08-14 NOTE — ED Provider Notes (Signed)
Medical screening examination/treatment/procedure(s) were performed by non-physician practitioner and as supervising physician I was immediately available for consultation/collaboration.  Cyndra Numbers, MD 08/14/11 [phone]

## 2011-08-28 ENCOUNTER — Encounter (INDEPENDENT_AMBULATORY_CARE_PROVIDER_SITE_OTHER): Payer: Self-pay | Admitting: General Surgery

## 2011-08-28 ENCOUNTER — Ambulatory Visit (INDEPENDENT_AMBULATORY_CARE_PROVIDER_SITE_OTHER): Payer: Self-pay | Admitting: General Surgery

## 2011-08-28 VITALS — BP 124/80 | HR 70 | Temp 97.6°F | Resp 14 | Ht 68.0 in | Wt 204.1 lb

## 2011-08-28 DIAGNOSIS — Z09 Encounter for follow-up examination after completed treatment for conditions other than malignant neoplasm: Secondary | ICD-10-CM | POA: Insufficient documentation

## 2011-08-28 NOTE — Progress Notes (Signed)
The patient is status post laparoscopic appendectomy. He is doing very well. He's had no fevers or chills. He has minimal abdominal discomfort. 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  On examination his wounds have healed well with no evidence of infection. He has no abdominal pain, masses, or tenderness  Doing well status post laparoscopic appendectomy.  The patient is to return to see me on a when necessary basis. All will give him a release letter to go back to work on Monday, 08/31/2011

## 2012-06-23 ENCOUNTER — Encounter (HOSPITAL_COMMUNITY): Payer: Self-pay | Admitting: Emergency Medicine

## 2012-06-23 ENCOUNTER — Emergency Department (INDEPENDENT_AMBULATORY_CARE_PROVIDER_SITE_OTHER): Payer: Self-pay

## 2012-06-23 ENCOUNTER — Emergency Department (HOSPITAL_COMMUNITY): Payer: Self-pay

## 2012-06-23 ENCOUNTER — Emergency Department (HOSPITAL_COMMUNITY)
Admission: EM | Admit: 2012-06-23 | Discharge: 2012-06-23 | Disposition: A | Discharge status: Home / Self Care | Payer: Self-pay | Source: Home / Self Care | Attending: Emergency Medicine | Admitting: Emergency Medicine

## 2012-06-23 DIAGNOSIS — M109 Gout, unspecified: Secondary | ICD-10-CM

## 2012-06-23 DIAGNOSIS — M1990 Unspecified osteoarthritis, unspecified site: Secondary | ICD-10-CM

## 2012-06-23 LAB — URIC ACID: Uric Acid, Serum: 7 mg/dL (ref 4.0–7.8)

## 2012-06-23 LAB — CBC WITH DIFFERENTIAL/PLATELET
Basophils Absolute: 0.1 10*3/uL (ref 0.0–0.1)
Eosinophils Absolute: 0.4 10*3/uL (ref 0.0–0.7)
Eosinophils Relative: 4 % (ref 0–5)
Lymphocytes Relative: 24 % (ref 12–46)
MCH: 30.3 pg (ref 26.0–34.0)
MCV: 85 fL (ref 78.0–100.0)
Platelets: 224 10*3/uL (ref 150–400)
RDW: 13 % (ref 11.5–15.5)
WBC: 9.9 10*3/uL (ref 4.0–10.5)

## 2012-06-23 MED ORDER — HYDROCODONE-ACETAMINOPHEN 5-325 MG PO TABS
2.0000 | ORAL_TABLET | Freq: Once | ORAL | Status: AC
  Administered 2012-06-23: 2 via ORAL

## 2012-06-23 MED ORDER — KETOROLAC TROMETHAMINE 60 MG/2ML IM SOLN
INTRAMUSCULAR | Status: AC
  Filled 2012-06-23: qty 2

## 2012-06-23 MED ORDER — HYDROCODONE-ACETAMINOPHEN 5-325 MG PO TABS: ORAL_TABLET | ORAL | Status: AC

## 2012-06-23 MED ORDER — DICLOFENAC SODIUM 75 MG PO TBEC: 75.0000 mg | DELAYED_RELEASE_TABLET | Freq: Two times a day (BID) | ORAL | Status: AC

## 2012-06-23 MED ORDER — KETOROLAC TROMETHAMINE 60 MG/2ML IM SOLN
60.0000 mg | Freq: Once | INTRAMUSCULAR | Status: AC
Start: 2012-06-23 — End: 2012-06-23
  Administered 2012-06-23: 60 mg via INTRAMUSCULAR

## 2012-06-23 MED ORDER — HYDROCODONE-ACETAMINOPHEN 5-325 MG PO TABS
ORAL_TABLET | ORAL | Status: AC
  Filled 2012-06-23: qty 2

## 2012-06-23 MED ORDER — COLCHICINE 0.6 MG PO TABS: ORAL_TABLET | ORAL | Status: DC

## 2012-06-23 NOTE — ED Notes (Signed)
Discharge instructions reviewed with pt.  Awaiting return of friend.

## 2012-06-23 NOTE — ED Notes (Signed)
Patient verified he has ride home & will not be driving.  Crackers provided.  Pt to XR dept.

## 2012-06-23 NOTE — ED Notes (Signed)
Additional refreshments provided.  Updated on wait.

## 2012-06-23 NOTE — ED Notes (Signed)
C/O right ankle pain and swelling x 3 days without known cause.  Denies injury.  Has not been taking any measures to help alleviate pain.  Right ankle and lateral foot area swollen and tender.  Reports "slight" fevers at home.

## 2012-06-23 NOTE — ED Provider Notes (Signed)
Chief Complaint:   Chief Complaint  Patient presents with  . Ankle Pain    History of Present Illness:   Erik Paul is a 52 year old male, a Spanish speaker, who comes in today with his girlfriend who speaks a little bit of [place]. The girl friend served as a family interpreter, and we also used a facility interpreter for instructions. He presents with a three-day history of pain in the right lateral ankle. He denies any injury. It's swollen, red, hot, and tender. He states he's had a fever of 101 at home, but here his temperature is only 99.4. He has not had any prior history of pain like this or of gout. He does have a history of pain in both of his knees for the past 6-7 months. He denies any injury or swelling. He saw a physician in [place] who gave him some medication but this didn't really help.  Review of Systems:  Other than noted above, the patient denies any of the following symptoms: Systemic:  No fevers, chills, sweats, or aches.  No fatigue or tiredness. Musculoskeletal:  No joint pain, arthritis, bursitis, swelling, back pain, or neck pain. Neurological:  No muscular weakness, paresthesias, headache, or trouble with speech or coordination.  No dizziness.  PMFSH:  Past medical history, family history, social history, meds, and allergies were reviewed.    Physical Exam:   Vital signs:  BP 156/86  Pulse 80  Temp(Src) 99.4 F (37.4 C) (Oral)  Resp 16  SpO2 96% Gen:  Alert and oriented times 3.  In no distress. Musculoskeletal: Exam of the ankle reveals swelling, erythema, heat, and tenderness to palpation over the lateral malleolus of the ankle. There is pain on movement of the ankle and decreased range of motion. Exam of the knees reveals no swelling, erythema, or pain to palpation. Both knees have a full range of motion with crepitus and pain with flexion. McMurray sign was negative, Lachman's sign was negative, and anterior sign was negative. There is a 1 cm  polypoid lesion on the posterolateral left knee which was noted on the x-ray.  Otherwise, all joints had a full a ROM with no swelling, bruising or deformity.  No edema, pulses full. Extremities were warm and pink.  Capillary refill was brisk.  Skin:  Clear, warm and dry.  No rash. Neuro:  Alert and oriented times 3.  Muscle strength was normal.  Sensation was intact to light touch.   Radiology:  Dg Ankle Complete Right  06/23/2012   *RADIOLOGY REPORT*  Clinical Data: Lateral ankle pain/swelling, no known injury  RIGHT ANKLE - COMPLETE 3+ VIEW  Comparison: None.  Findings: No fracture or dislocation is seen.  The ankle mortise is intact.  The base of the fifth metatarsal is unremarkable.  Mild lateral soft tissue swelling.  IMPRESSION: No fracture or dislocation is seen.  Mild lateral soft tissue swelling.   Original Report Authenticated By: Charline Bills, M.D.   Dg Knee Complete 4 Views Left  06/23/2012   *RADIOLOGY REPORT*  Clinical Data: Progressive knee pain.  No known injury.  LEFT KNEE - COMPLETE 4+ VIEW  Comparison: None.  Findings: No evidence of fracture or dislocation.  No definite evidence of knee joint effusion.  Mild medial compartment osteoarthritis is seen.  In addition, there is a cutaneous soft tissue nodule or mass along the posterolateral skin surface of the knee.  IMPRESSION:  1.  No acute findings. 2.  Mild medial compartment osteoarthritis. 3.  Cutaneous soft tissue  nodule or mass along the posterolateral skin surface.   Original Report Authenticated By: Myles Rosenthal, M.D.   I reviewed this x-ray, and the soft tissue mass correlates with a 1 cm skin polyp in the popliteal fossa.  Dg Knee Complete 4 Views Right  06/23/2012   *RADIOLOGY REPORT*  Clinical Data: Knee pain for 6 months, no injury  RIGHT KNEE - COMPLETE 4+ VIEW  Comparison: None  Findings: Osseous mineralization normal. Medial compartment joint space narrowing and marginal spur formation. Mild irregularity of articular  surface of the femoral condyle with subchondral sclerosis. No acute fracture, dislocation, or bone destruction. Less significant degenerative changes at patellofemoral joint lateral compartment. No definite knee joint effusion.  IMPRESSION: Osteoarthritic changes of the right knee greatest at the medial compartment.   Original Report Authenticated By: Ulyses Southward, M.D.   I reviewed the images independently and personally and concur with the radiologist's findings.  Course in Urgent Care Center:   He was given Toradol 60 mg IM and Norco 5/325, 2 tablets by mouth. This did seem to help with his pain. When he first came in he was in a wheelchair and he was able to walk out.  Assessment:  The primary encounter diagnosis was Gout. A diagnosis of Osteoarthritis was also pertinent to this visit.  Most likely cause of this is gout. His white count was normal his fever was normal. It's possible that the elevated temperature at home either been due to a gout attack. I don't see anything to make me suspect septic arthritis. I think his knee pain is a separate issue and is probably due to osteoarthritis or internal knee cartilage damage.  Plan:   1.  The following meds were prescribed:   New Prescriptions   COLCHICINE 0.6 MG TABLET    Take 2 now and 1 in 1 hour.  May repeat dose once daily.  For gout attack.   DICLOFENAC (VOLTAREN) 75 MG EC TABLET    Take 1 tablet (75 mg total) by mouth 2 (two) times daily.   HYDROCODONE-ACETAMINOPHEN (NORCO/VICODIN) 5-325 MG PER TABLET    1 to 2 tabs every 4 to 6 hours as needed for pain.   2.  The patient was instructed in symptomatic care, including rest and activity, elevation, application of ice and compression.  Appropriate handouts were given. He was also given information about a low purine diet. 3.  The patient was told to return if becoming worse in any way, if no better in 3 or 4 days, and given some red flag symptoms such as increasing fever, worsening pain, or  neurological symptoms that would indicate earlier return.   4.  The patient was told to follow up with Dr. Jodi Geralds next week.    Reuben Likes, MD 06/23/12 2103

## 2012-06-23 NOTE — ED Notes (Signed)
Continues to wait for ride home.

## 2012-06-28 ENCOUNTER — Ambulatory Visit: Payer: Self-pay | Attending: Internal Medicine | Admitting: Internal Medicine

## 2012-06-28 ENCOUNTER — Encounter: Payer: Self-pay | Admitting: Internal Medicine

## 2012-06-28 VITALS — BP 153/101 | HR 60 | Temp 98.5°F | Resp 14 | Ht 69.0 in | Wt 201.0 lb

## 2012-06-28 DIAGNOSIS — M109 Gout, unspecified: Secondary | ICD-10-CM | POA: Insufficient documentation

## 2012-06-28 DIAGNOSIS — M159 Polyosteoarthritis, unspecified: Secondary | ICD-10-CM | POA: Insufficient documentation

## 2012-06-28 DIAGNOSIS — M10479 Other secondary gout, unspecified ankle and foot: Secondary | ICD-10-CM | POA: Insufficient documentation

## 2012-06-28 MED ORDER — PREDNISONE 20 MG PO TABS: 20.0000 mg | ORAL_TABLET | Freq: Every day | ORAL | Status: AC

## 2012-06-28 MED ORDER — COLCHICINE 0.6 MG PO TABS: 0.6000 mg | ORAL_TABLET | Freq: Three times a day (TID) | ORAL | Status: AC

## 2012-06-28 NOTE — Progress Notes (Signed)
Rechecked blood pressure due to high stats. Rechecked Blood Pressure was 153/101.

## 2012-06-28 NOTE — Progress Notes (Signed)
Subjective:    Patient ID: Erik Paul, male    DOB: January 10, 1961, 52 y.o.   MRN: [id number]  HPI  52 year old male, a Spanish speaker, who comes in today with his girlfriend who speaks a little bit of [place]. The girl friend served as a family interpreter, and we also used a facility interpreter for instructions. He presents with a ten-day history of pain in the right lateral ankle. He denies any injury. It's swollen, red, hot, and tender. He was in urgent care on 5/22 and was prescribed culture seen and Vicodin for the pain. He has not had any prior history of pain like this or of gout. He does have a history of pain in both of his knees for the past 6-7 months. He denies any injury or swelling. He saw a physician in [place] who gave him some medication but this didn't really help.  He had plain films that were suggestive of osteoarthritis His uric acid was 7.0   Review of Systems: Other than noted above, the patient denies any of the following symptoms:  Systemic: No fevers, chills, sweats, or aches. No fatigue or tiredness.  Musculoskeletal: No joint pain, arthritis, bursitis, swelling, back pain, or neck pain.  Neurological: No muscular weakness, paresthesias, headache, or trouble with speech or coordination. No dizziness.   PMFSH: Past medical history, family history, social history, meds, and allergies were reviewed.   Physical Exam:  Vital signs: BP 156/86  Pulse 80  Temp(Src) 99.4 F (37.4 C) (Oral)  Resp 16  SpO2 96%  Gen: Alert and oriented times 3. In no distress.  Musculoskeletal: Exam of the ankle reveals swelling, erythema, heat, and tenderness to palpation over the lateral malleolus of the ankle. There is pain on movement of the ankle and decreased range of motion. Exam of the knees reveals no swelling, erythema, or pain to palpation. Both knees have a full range of motion with crepitus and pain with flexion. McMurray sign was negative, Lachman's sign was  negative, and anterior sign was negative. There is a 1 cm polypoid lesion on the posterolateral left knee which was noted on the x-ray. Otherwise, all joints had a full a ROM with no swelling, bruising or deformity. No edema, pulses full. Extremities were warm and pink. Capillary refill was brisk.  Skin: Clear, warm and dry. No rash.  Neuro: Alert and oriented times 3. Muscle strength was normal. Sensation was intact to light touch.    Radiology: Dg Ankle Complete Right  06/23/2012 *RADIOLOGY REPORT* Clinical Data: Lateral ankle pain/swelling, no known injury RIGHT ANKLE - COMPLETE 3+ VIEW Comparison: None. Findings: No fracture or dislocation is seen. The ankle mortise is intact. The base of the fifth metatarsal is unremarkable. Mild lateral soft tissue swelling. IMPRESSION: No fracture or dislocation is seen. Mild lateral soft tissue swelling. Original Report Authenticated By: Charline Bills, M.D.  Dg Knee Complete 4 Views Left  06/23/2012 *RADIOLOGY REPORT* Clinical Data: Progressive knee pain. No known injury. LEFT KNEE - COMPLETE 4+ VIEW Comparison: None. Findings: No evidence of fracture or dislocation. No definite evidence of knee joint effusion. Mild medial compartment osteoarthritis is seen. In addition, there is a cutaneous soft tissue nodule or mass along the posterolateral skin surface of the knee. IMPRESSION: 1. No acute findings. 2. Mild medial compartment osteoarthritis. 3. Cutaneous soft tissue nodule or mass along the posterolateral skin surface. Original Report Authenticated By: Myles Rosenthal, M.D.  I reviewed this x-ray, and the soft tissue mass correlates with a 1 cm  skin polyp in the popliteal fossa.  Dg Knee Complete 4 Views Right  06/23/2012 *RADIOLOGY REPORT* Clinical Data: Knee pain for 6 months, no injury RIGHT KNEE - COMPLETE 4+ VIEW Comparison: None Findings: Osseous mineralization normal. Medial compartment joint space narrowing and marginal spur formation. Mild irregularity of  articular surface of the femoral condyle with subchondral sclerosis. No acute fracture, dislocation, or bone destruction. Less significant degenerative changes at patellofemoral joint lateral compartment. No definite knee joint effusion. IMPRESSION: Osteoarthritic changes of the right knee greatest at the medial compartment. Original Report Authenticated By: Ulyses Southward, M.D.  I reviewed the images independently and personally and concur with the radiologist's findings.    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

## 2015-08-07 ENCOUNTER — Encounter (HOSPITAL_COMMUNITY): Payer: Self-pay | Admitting: Family Medicine

## 2015-08-07 ENCOUNTER — Ambulatory Visit (HOSPITAL_COMMUNITY)
Admission: EM | Admit: 2015-08-07 | Discharge: 2015-08-07 | Disposition: A | Discharge status: Home / Self Care | Payer: Self-pay | Attending: Emergency Medicine | Admitting: Emergency Medicine

## 2015-08-07 ENCOUNTER — Ambulatory Visit (INDEPENDENT_AMBULATORY_CARE_PROVIDER_SITE_OTHER): Payer: Self-pay

## 2015-08-07 DIAGNOSIS — S93402A Sprain of unspecified ligament of left ankle, initial encounter: Secondary | ICD-10-CM

## 2015-08-07 MED ORDER — IBUPROFEN 800 MG PO TABS
ORAL_TABLET | ORAL | Status: AC
  Filled 2015-08-07: qty 1

## 2015-08-07 MED ORDER — HYDROCODONE-ACETAMINOPHEN 5-325 MG PO TABS
1.0000 | ORAL_TABLET | Freq: Once | ORAL | Status: AC
  Administered 2015-08-07: 1 via ORAL

## 2015-08-07 MED ORDER — IBUPROFEN 800 MG PO TABS: 800.0000 mg | ORAL_TABLET | Freq: Three times a day (TID) | ORAL | Status: AC

## 2015-08-07 MED ORDER — TRAMADOL HCL 50 MG PO TABS: 50.0000 mg | ORAL_TABLET | Freq: Four times a day (QID) | ORAL | Status: AC | PRN

## 2015-08-07 MED ORDER — HYDROCODONE-ACETAMINOPHEN 5-325 MG PO TABS
ORAL_TABLET | ORAL | Status: AC
  Filled 2015-08-07: qty 1

## 2015-08-07 MED ORDER — IBUPROFEN 800 MG PO TABS
800.0000 mg | ORAL_TABLET | Freq: Once | ORAL | Status: AC
  Administered 2015-08-07: 800 mg via ORAL

## 2015-08-07 NOTE — ED Provider Notes (Signed)
CSN: [id number]     Arrival date & time 08/07/15  1827 History   First MD Initiated Contact with Patient 08/07/15 1958     Chief Complaint  Patient presents with  . Ankle Pain  . Foot Pain   (Consider location/radiation/quality/duration/timing/severity/associated sxs/prior Treatment) HPI Comments: 55 year old male states that he twisted his left ankle 4 days ago. He states the ankle pain and swelling is getting worse.   Past Medical History  Diagnosis Date  . Hypertension    Past Surgical History  Procedure Laterality Date  . Laparoscopic appendectomy  08/08/2011    Procedure: APPENDECTOMY LAPAROSCOPIC;  Surgeon: Cherylynn RidgesJames O Wyatt, MD;  [facility] OR;  Service: General;  Laterality: N/A;   History reviewed. No pertinent family history. Social History  Substance Use Topics  . Smoking status: Former Smoker    Quit date: 08/28/2010  . Smokeless tobacco: Never Used  . Alcohol Use: No    Review of Systems  Constitutional: Negative.   Respiratory: Negative.   Gastrointestinal: Negative.   Genitourinary: Negative.   Musculoskeletal: Positive for joint swelling and arthralgias.       As per HPI  Skin: Negative.   Neurological: Negative for dizziness, weakness, numbness and headaches.    Allergies  Review of patient's allergies indicates no known allergies.  Home Medications   Prior to Admission medications   Medication Sig Start Date End Date Taking? Authorizing Provider  colchicine 0.6 MG tablet Take 1 tablet (0.6 mg total) by mouth 3 (three) times daily after meals. Take 2 now and 1 in 1 hour.  May repeat dose once daily.  For gout attack. 06/28/12   Richarda OverlieNayana Abrol, MD  diclofenac (VOLTAREN) 75 MG EC tablet Take 1 tablet (75 mg total) by mouth 2 (two) times daily. 06/23/12   Reuben Likesavid C Keller, MD  HYDROcodone-acetaminophen (NORCO/VICODIN) 5-325 MG per tablet Take 1 tablet by mouth as needed.    Historical Provider, MD  HYDROcodone-acetaminophen (NORCO/VICODIN) 5-325 MG per tablet 1  to 2 tabs every 4 to 6 hours as needed for pain. 06/23/12   Reuben Likesavid C Keller, MD  ibuprofen (ADVIL,MOTRIN) 800 MG tablet Take 1 tablet (800 mg total) by mouth 3 (three) times daily. 08/07/15   Hayden Rasmussenavid Keshawn Sundberg, NP  traMADol (ULTRAM) 50 MG tablet Take 1 tablet (50 mg total) by mouth every 6 (six) hours as needed. 08/07/15   Hayden Rasmussenavid Tiare Rohlman, NP  UNKNOWN TO PATIENT HTN med    Historical Provider, MD   Meds Ordered and Administered this Visit   Medications  HYDROcodone-acetaminophen (NORCO/VICODIN) 5-325 MG per tablet 1 tablet (not administered)  ibuprofen (ADVIL,MOTRIN) tablet 800 mg (not administered)    BP 164/80 mmHg  Pulse 79  Temp(Src) 98.5 F (36.9 C) (Oral)  Resp 12  SpO2 97% No data found.   Physical Exam  Constitutional: He is oriented to person, place, and time. He appears well-developed and well-nourished. No distress.  HENT:  Head: Normocephalic and atraumatic.  Eyes: EOM are normal. Left eye exhibits no discharge.  Neck: Normal range of motion. Neck supple.  Musculoskeletal:  Left ankle with swelling, tenderness without deformity. No tenderness to the foot. Swelling has extravasated to portion of the proximal foot. Increased pain with attempts at weightbearing. There is surrounding light ecchymosis. Greatest tenderness to the bilateral malleolar light. Pedal pulses 2+. Distal neurovascular motor sensory is intact.  Neurological: He is alert and oriented to person, place, and time. No cranial nerve deficit.  Skin: Skin is warm and dry.  Psychiatric: He  has a normal mood and affect.  Nursing note and vitals reviewed.   ED Course  Procedures (including critical care time)  Labs Review Labs Reviewed - No data to display  Imaging Review Dg Ankle Complete Left  08/07/2015  CLINICAL DATA:  Twisted left ankle 2 days ago.  Generalized pain. EXAM: LEFT ANKLE COMPLETE - 3+ VIEW COMPARISON:  None. FINDINGS: Circumferential soft tissue swelling. Small joint effusion. No definite acute fracture.  Question tiny cortical avulsion of the tip of the fibula. IMPRESSION: Soft tissue swelling. Small joint effusion. Question tiny cortical avulsion at the tip of the fibula. Electronically Signed   By: Paulina FusiMark  Shogry M.D.   On: 08/07/2015 21:03     Visual Acuity Review  Right Eye Distance:   Left Eye Distance:   Bilateral Distance:    Right Eye Near:   Left Eye Near:    Bilateral Near:         MDM   1. Ankle sprain, left, initial encounter    RICE Rest, ice, elevation, wear splint. No weight bearing for 4 to 5 days, then as tolerated. ASO and crutches. F/U with ortho as directed Meds ordered this encounter  Medications  . traMADol (ULTRAM) 50 MG tablet    Sig: Take 1 tablet (50 mg total) by mouth every 6 (six) hours as needed.    Dispense:  15 tablet    Refill:  0    Order Specific Question:  Supervising Provider    Answer:  Charm RingsHONIG, ERIN J [id number]]  . ibuprofen (ADVIL,MOTRIN) 800 MG tablet    Sig: Take 1 tablet (800 mg total) by mouth 3 (three) times daily.    Dispense:  21 tablet    Refill:  0    Order Specific Question:  Supervising Provider    Answer:  Charm RingsHONIG, ERIN J [id number]]  . HYDROcodone-acetaminophen (NORCO/VICODIN) 5-325 MG per tablet 1 tablet    Sig:   . ibuprofen (ADVIL,MOTRIN) tablet 800 mg    Sig:       Hayden Rasmussenavid Kaitelyn Jamison, NP 08/07/15 2124

## 2015-08-07 NOTE — Discharge Instructions (Signed)
Esguince agudo de tobillo, con rehabilitacin fase I (Acute Ankle Sprain With Phase I Rehab) Rest, ice, elevation, wear splint. No weight bearing for 4 to 5 days, then as tolerated.  El esguince agudo de tobillo es un desgarro parcial o completo de uno o ms ligamentos debido a una lesin por traumatismo. La gravedad de la lesin depende de la cantidad de ligamentos esguinzados y el grado del esguince. Hay 3 categoras de esguinces.  El Heringtongrado 1 es un esguince leve. Hay un ligero estiramiento sin ruptura evidente. No hay prdida de fuerza, y [address] m[address]ulo y el ligamento tienen el largo correcto.  El Shell Pointgrado 2 es un esguince moderado. Hay ruptura de las fibras que se encuentran dentro de la sustancia del ligamento, Medical laboratory scientific officeren el lugar en que MGM MIRAGEune dos huesos o Database administratordos cartlagos. El largo del ligamento est aumentado y generalmente hay disminucin de la fuerza.  Un esguince en grado 3 es la ruptura completa del tendn y no es frecuente. Adems del grado del esguince, hay tres tipos de esguince de tobillo. Esguince lateral de tobillo: Ocurre en uno o ms ligamentos de la parte externa (lateral) del tobillo. Aqu se producen las lesiones con ms frecuencia. Esguince interno o medial de tobillo: Hay un ligamento triangular en la parte interna (media) del tobillo que es susceptible a lesiones. El esguince medial de tobillo es menos comn. Esguince de sindesmosis "tobillo superior": La sindesmosis es un ligamento que Colgate Palmoliveune los dos huesos de la parte inferior de la pierna. El esguince de sindesmosis suele ocurrir slo con esguinces muy graves del Corvallistobillo. SNTOMAS  Dolor, sensibilidad e hinchazn en el tobillo, que comienza en el lado de la lesin y que con el tiempo puede avanzar hacia todo el tobillo y el pie.  Sensacin de estallido o ruptura en el momento de la lesin.  Hematoma que puede extenderse hasta el taln.  Imposibilidad de caminar poco despus de producida la lesin. CAUSAS  Los esguinces agudos de tobillo  se deben a una presin ejercida temporalmente sobre el hueso del tobillo que saca el astrgalo de su ubicacin normal.  Distensin o ruptura de los ligamentos que normalmente sostienen la articulacin en su lugar (generalmente debido a una torcedura). LOS RIESGOS AUMENTAN CON:  Esguince previo del tobillo.  Actividades en las que el pie se apoya de manera inadecuada (como en el bsquet, el vley y el ftbol) o caminar o correr sobre superficies desparejas o rugosas.  Zapatos sin soporte adecuado que Textron Inceviten los movimientos hacia los lados cuando se produce la presin.  Poca fuerza y flexibilidad.  Dificultad para mantener el equilibrio.  Deportes de contacto. PREVENCIN  Precalentamiento adecuado y elongacin antes de la Greenviewactividad.  Mantener la forma fsica:  Flexibilidad del tobillo y la pierna, fuerza y resistencia muscular.  Capacidad cardiovascular.  Actividades que mejoren el equilibrio.  Usar la tcnica correcta y Warehouse managertener un entrenador que corrija la tcnica incorrecta.  Uso de Qatarcinta adhesiva, vendajes, tobilleras o botines que Avayaeviten las lesiones. En un comienzo puede utilizarse Qatarcinta adhesiva; sin embargo pierde su capacidad de sostn a los 10  15 minutos.  Utilice zapatos protectores adecuados (los botines combinados con vendajes o sujetadores es ms efectivo que el uso de slo uno de ellos).  Durante los 12 meses posteriores a la lesin, proteja el tobillo durante la prctica de deportes y Keyportotras actividades. PRONSTICO  Si se trata adecuadamente, el esguince de tobillo podr curarse por completo; sin embargo, el tiempo de recuperacin depende del grado del esguince.  Un esguince de grado 1 est lo suficientemente curado [employer] [address] y [address] como para permitir realizar actividades modificadas y requiere un promedio de 6 semanas para curarse completamente.  Los esguinces de grado 2 necesitan entre 6 y 10 semanas para curarse completamente.  Los esguinces de grado 3  necesitan entre 12 y 16 semanas para curarse.  Un esguince de sindesmosis habitualmente demora ms de 3 meses en curarse. COMPLICACIONES RELACIONADAS  La recurrencia frecuente de los sntomas puede dar como resultado un problema crnico. Un tratamiento adecuado del problema la primera vez que ocurre, disminuye la frecuencia de recurrencias y optimiza el tiempo de curacin. La gravedad del esguince inicial no predice la probabilidad de inestabilidad futura.  Lesiones en otras estructuras, (hueso, cartlago o tendn).  Si se repiten los esguinces puede dar lugar a una articulacin crnicamente inestable o artrtica. TRATAMIENTO El tratamiento inicial consiste en la toma de medicamentos y la aplicacin de hielo y vendas por compresin para Engineer, materials y reducir la hinchazn. El esguince de tobillo suele inmovilizarse con un yeso o bota para permitir la curacin. Podrn recomendarse muletas para reducir la presin en la lesin. Luego de la inmovilizacin podr ser Passenger transport manager ejercicios de fortalecimiento y estiramiento para ganar fuerza y un rango completo de [place]. No es frecuente la ciruga para tratar el esguince de tobillo. MEDICAMENTOS  Generalmente se indican antiinflamatorios no esteroides como la aspirina o el ibuprofeno (no los tome durante los [address] a la lesin ni dentro de los 7 [address]  [address] previos a la [place]), u otros calmantes menores como acetaminofeno. Tome los [employer] como le indic su mdico. Comunquese inmediatamente con el mdico si tiene alguna hemorragia, molestias estomacales o seales de una reaccin alrgica como consecuencia de estos medicamentos.  Podr beneficiarse con ungentos o linimentos tpicos.  Cuando lo necesite, Engineer, building services. No tome medicamentos recetados para el dolor durante ms de 4 a 7 das. Utilcelos como se le indique y slo cuando lo necesite. CALOR Y FRO  El fro se [place] para Engineer, materials  y reducir la inflamacin para casos agudos y crnicos. El fro debe aplicarse durante 10 a 15 minutos cada 2  3 horas para reducir la inflamacin y Chief Technology Officer e inmediatamente despus de cualquier actividad que agrava los sntomas. Utilice bolsas o un masaje de hielo.  El calor debe utilizarse antes de Education officer, environmental ejercicios de estiramiento y fortalecimiento prescriptos por el mdico. Utilice una bolsa trmica o un pao hmedo. SOLICITE ATENCIN MDICA DE INMEDIATO SI:   Tuviera dolor, hinchazn o hematomas que empeoran a pesar del tratamiento.  Siente dolor, adormecimiento cambios en el color o fro en el pie o en los dedos.  Aparecen sntomas nuevos e inesperados (las drogas utilizadas en el tratamiento pueden producir efectos secundarios). EJERCICIOS  EJERCICIOS FASE I EJERCICIOS DE AMPLITUD DE MOVIMIENTOS Y ELONGACIN Esguince de tobillo agudo, fase I, semanas 1 a 2 Estos ejercicios le ayudarn al comienzo de la rehabilitacin de la lesin. Estos ejercicios generalmente se realizan durante las primeras 1  2 semanas luego de la lesin. Un vez que el mdico, fisioterapeuta o Herbalist vea un progreso adecuado, Licensed conveyancer con los ejercicios. Al completar estos ejercicios, recuerde:   Restaurar la flexibilidad del tejido ayuda a que las articulaciones recuperen el movimiento normal. Esto permite que el movimiento y la actividad sea ms saludables y menos dolorosos.  Para que sea efectiva, cada elongacin debe realizarse durante al menos 30 segundos.  El estiramiento  nunca debe debe ser doloroso. Deber sentir slo un alargamiento suave o estiramiento del tejido que [place]. AMPLITUD DE MOVIMIENTOS Flexin dorso plantar  Mientras est entado con la rodilla derecha / izquierdo derecha, lleve la parte superior del pie hacia arriba flexionando el tobillo. Luego realice Fifth Third Bancorpel movimiento inverso, y apunte con los pies Murrayhacia abajo.  Mantenga esta posicin durante __________ segundos.  Luego de completar  la primera serie de ejercicios, reptalo con la rodilla flexionada. Reptalo __________ veces. Realice este estiramiento __________ Anthoney Haradaveces por da.  AMPLITUD DE MOVIMIENTOS Alfabeto con el tobillo  Imagine que el dedo gordo del pie derecha / izquierdo es un lpiz.  Con la cadera y la rodilla quietas, escriba todo el alfabeto con el "lpiz". Llegue hasta donde pueda, sin que aumenten las [place]shiremolestias. Reptalo __________ veces. Realice este estiramiento __________ Anthoney Haradaveces por da.  EJERCICIOS DE FORTALECIMIENTO Esguince de tobillo agudo, fase I, semanas 1 a 2 Estos ejercicios le ayudarn al comienzo de la rehabilitacin de la fuerza del tobillo. Estos ejercicios generalmente se realizan durante las primeras 1  2 semanas luego de la lesin. Un vez que el mdico, fisioterapeuta o Herbalistentrenador vea un progreso adecuado, Licensed conveyanceravanzar con los ejercicios. Al completar estos ejercicios, recuerde:   Los msculos pueden ganar tanto la resistencia como la fortaleza que necesita para sus actividades diarias a travs de ejercicios controlados.  Realice los ejercicios como se lo indic el mdico, el fisioterapeuta o Orthoptistel entrenador. Aumente la resistencia y repeticiones segn se le haya indicado.  Podr experimentar dolor o cansancio muscular, pero el dolor o molestia que trata de eliminar a travs de los ejercicios nunca debe empeorar. Si el dolor empeora, detngase y asegrese de que est siguiendo las directivas correctamente. Si an siente dolor luego de Education officer, environmentalrealizar lo ajustes necesarios, deber discontinuar el ejercicio hasta que pueda conversar con el profesional sobre el problema. FUERZA Dorsiflexores  Asegure una banda de goma para ejercicios a un objeto fijo (ej, mesa, palo) y enrosque el otro extremo alrededor del Secretary/administratorpie derecha / izquierdo.  Sintese en el suelo de frente al objeto fijo. La banda debe estar ligeramente tensa cuando su pie est relajado.  Lentamente, lleve el pie hacia atrs con el tobillo y los dedos  del pie.  Mantenga esta posicin durante __________ segundos. Libere la tensin de la banda lentamente y coloque el pie en la posicin inicial. Reptalo __________ veces. Realice este estiramiento __________ Anthoney Haradaveces por da.  FUERZA Flexin plantar  Sintese con la pierna derecha / izquierdo extendida. Sostenga por los extremos una banda de goma para ejercicios y colquela alrededor de la regin metatarsiana del pie. Mantenga una tensin suave en la banda.  Empuje los dedos del pie lentamente, hacia el lado contrario a usted, que apunten Montereyhacia abajo.  Mantenga esta posicin durante __________ segundos. Vuelva lentamente a la posicin normal, y [place] May[place] Islandsmantenga una tensin en la banda. Reptalo __________ veces. Realice este estiramiento __________ Anthoney Haradaveces por da.  FUERZA Eversin del tobillo  Asegure un extremo de una banda de goma para ejercicios a un objeto fijo (mesa, palo). Ate el extremo opuesto a su pie, justo antes de los dedos.  Coloque los puos [place] Cityentre las rodillas. Esto har que la fuerza se concentre en el tobillo.  Lleve la Huntsman Corporationbanda hacia el pie contrario, lentamente, para que el dedo pequeo del pie salga apunte Norm[place] Glasgowhacia arriba y [place] Islandhacia afuera. Asegrese de que la banda est posicionada de tal forma que puede resistir el movimiento completo.  Mantenga esta posicin durante __________ segundos.  Haga que los msculos resistan la banda mientras tira lentamente el pie hacia atrs hasta la posicin inicial. Reptalo __________ veces. Realice este estiramiento __________ Anthoney Harada por da.  FUERZA - Inversin del tobillo  Asegure un extremo de una banda de goma para ejercicios a un objeto fijo (mesa, palo). Ate el extremo opuesto a su pie, justo antes de los dedos.  Coloque los [place] rodillas. Esto har que la fuerza se concentre en el tobillo.  Lentamente, tire del dedo gordo [place] y Honor Junes y asegrese de que la banda est posicionada de tal forma que puede resistir el  movimiento completo.  Mantenga esta posicin durante __________ segundos.  Haga que los msculos resistan la banda mientras tira lentamente el pie hacia atrs hasta la posicin inicial. Reptalo __________ veces. Realice este ejercicio __________ veces por da.  FUERZA Rollo de toalla  Sintese en una silla en una superficie no alfombrada.  Coloque el pie derecha / izquierdo en [address], y mantenga el taln en el suelo.  Coloque la toalla alrededor del taln pero envuelva slo los dedos del pie. Mantenga el taln contra el piso.  Aada peso al extremo de la toalla si el mdico, fisioterapeuta o entrenador se lo indican. Reptalo __________ veces. Realice este estiramiento __________ Anthoney Harada por da.   Esta informacin no tiene Theme park manager el consejo del mdico. Asegrese de hacerle al mdico cualquier pregunta que tenga.   Document Released: 11/05/2005 Document Revised: 02/09/2014 Elsevier Interactive Patient Education [employer]

## 2015-08-07 NOTE — ED Notes (Signed)
Pt here with sig swelling to left foot and ankle after twisting ankle at work on Saturday. Bruising noted. sts severe pain

## 2016-11-06 IMAGING — DX DG ANKLE COMPLETE 3+V*L*
3 series · 3 of 3 positions shown · non-contrast
Comparison: None.

CLINICAL DATA: Twisted left ankle 2 days ago.  Generalized pain.

EXAM:
LEFT ANKLE COMPLETE - 3+ VIEW

[ankle ap]
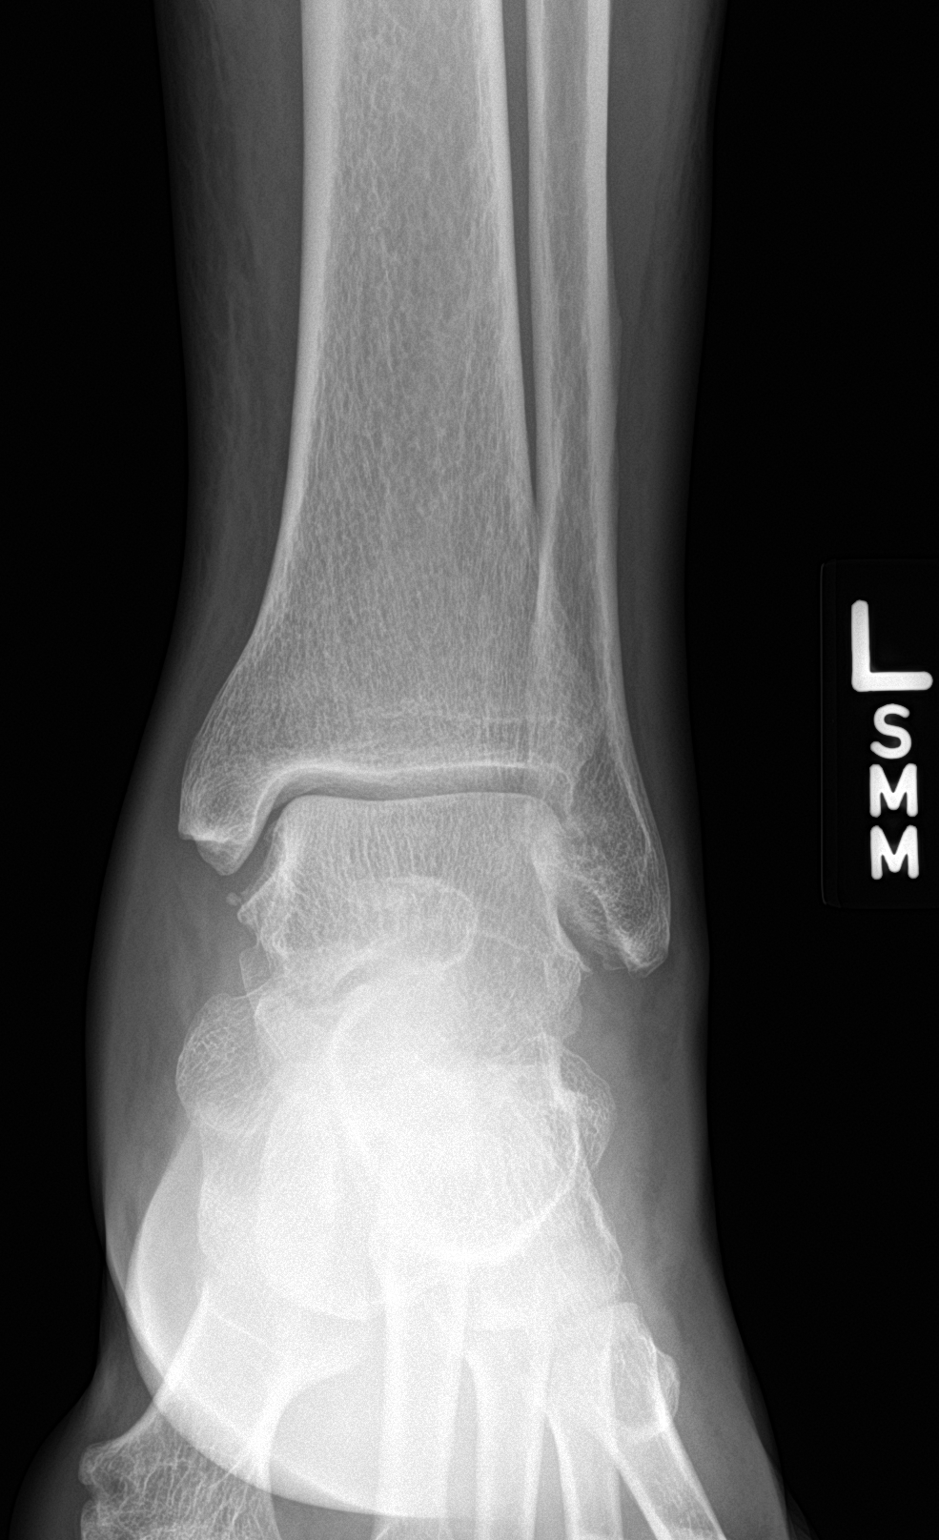

[ankle obl]
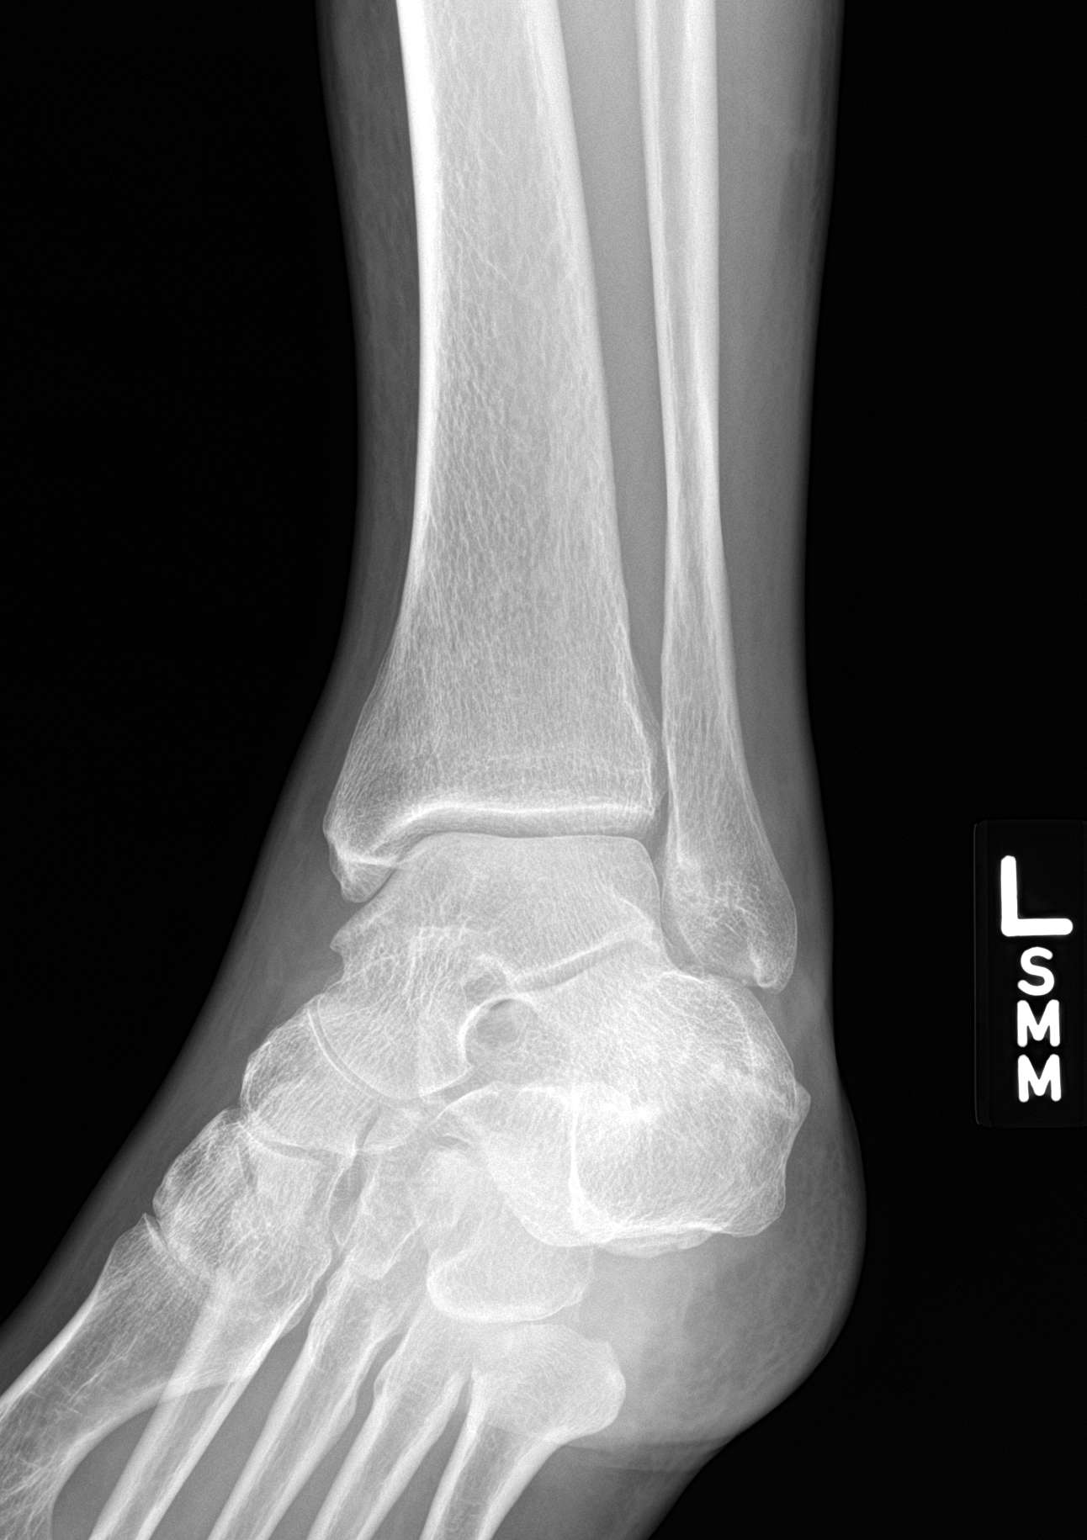

[ankle lat]
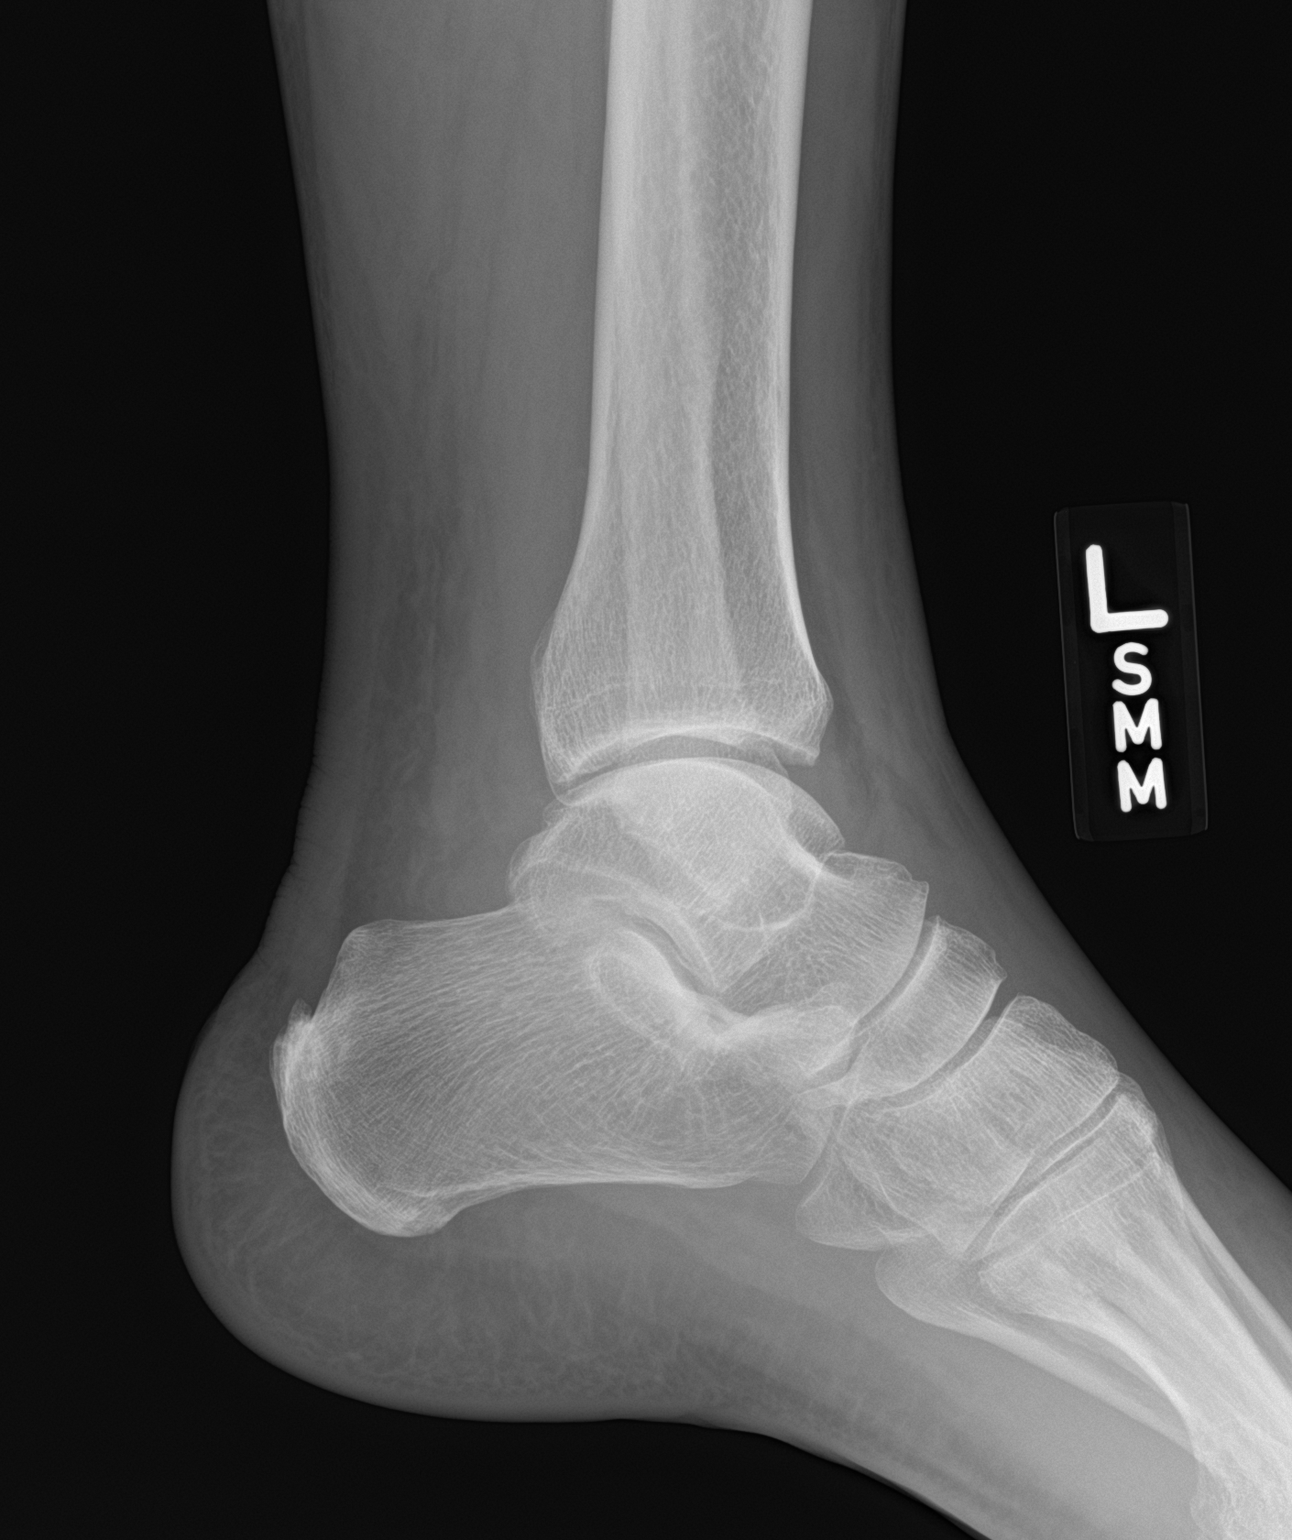

[3 of 3 positions shown; findings below may reference images not displayed]

FINDINGS: Circumferential soft tissue swelling. Small joint effusion. No
definite acute fracture. Question tiny cortical avulsion of the tip
of the fibula.
IMPRESSION: Soft tissue swelling. Small joint effusion. Question tiny cortical
avulsion at the tip of the fibula.
